# Patient Record
Sex: Male | Born: 1985 | Race: Black or African American | Hispanic: No | Marital: Single | Smoking: Current every day smoker
Health system: Southern US, Community
[De-identification: ages and names within clinical notes are randomized; demographics above are authoritative.]

---
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
## 2007-08-22 ENCOUNTER — Emergency Department (HOSPITAL_COMMUNITY): Admission: EM | Admit: 2007-08-22 | Discharge: 2007-08-22 | Payer: Self-pay | Admitting: Emergency Medicine

## 2007-09-04 ENCOUNTER — Emergency Department (HOSPITAL_COMMUNITY): Admission: EM | Admit: 2007-09-04 | Discharge: 2007-09-04 | Payer: Self-pay | Admitting: Emergency Medicine

## 2009-04-23 ENCOUNTER — Emergency Department (HOSPITAL_COMMUNITY): Admission: EM | Admit: 2009-04-23 | Discharge: 2009-04-23 | Payer: Self-pay | Admitting: Emergency Medicine

## 2009-11-19 IMAGING — CR DG CHEST 2V
2 series · 2 of 2 positions shown · non-contrast
Comparison: 08/22/2007

CLINICAL DATA: Dizzy/flank pain/short of breath

CHEST - 2 VIEW

[w chest pa]
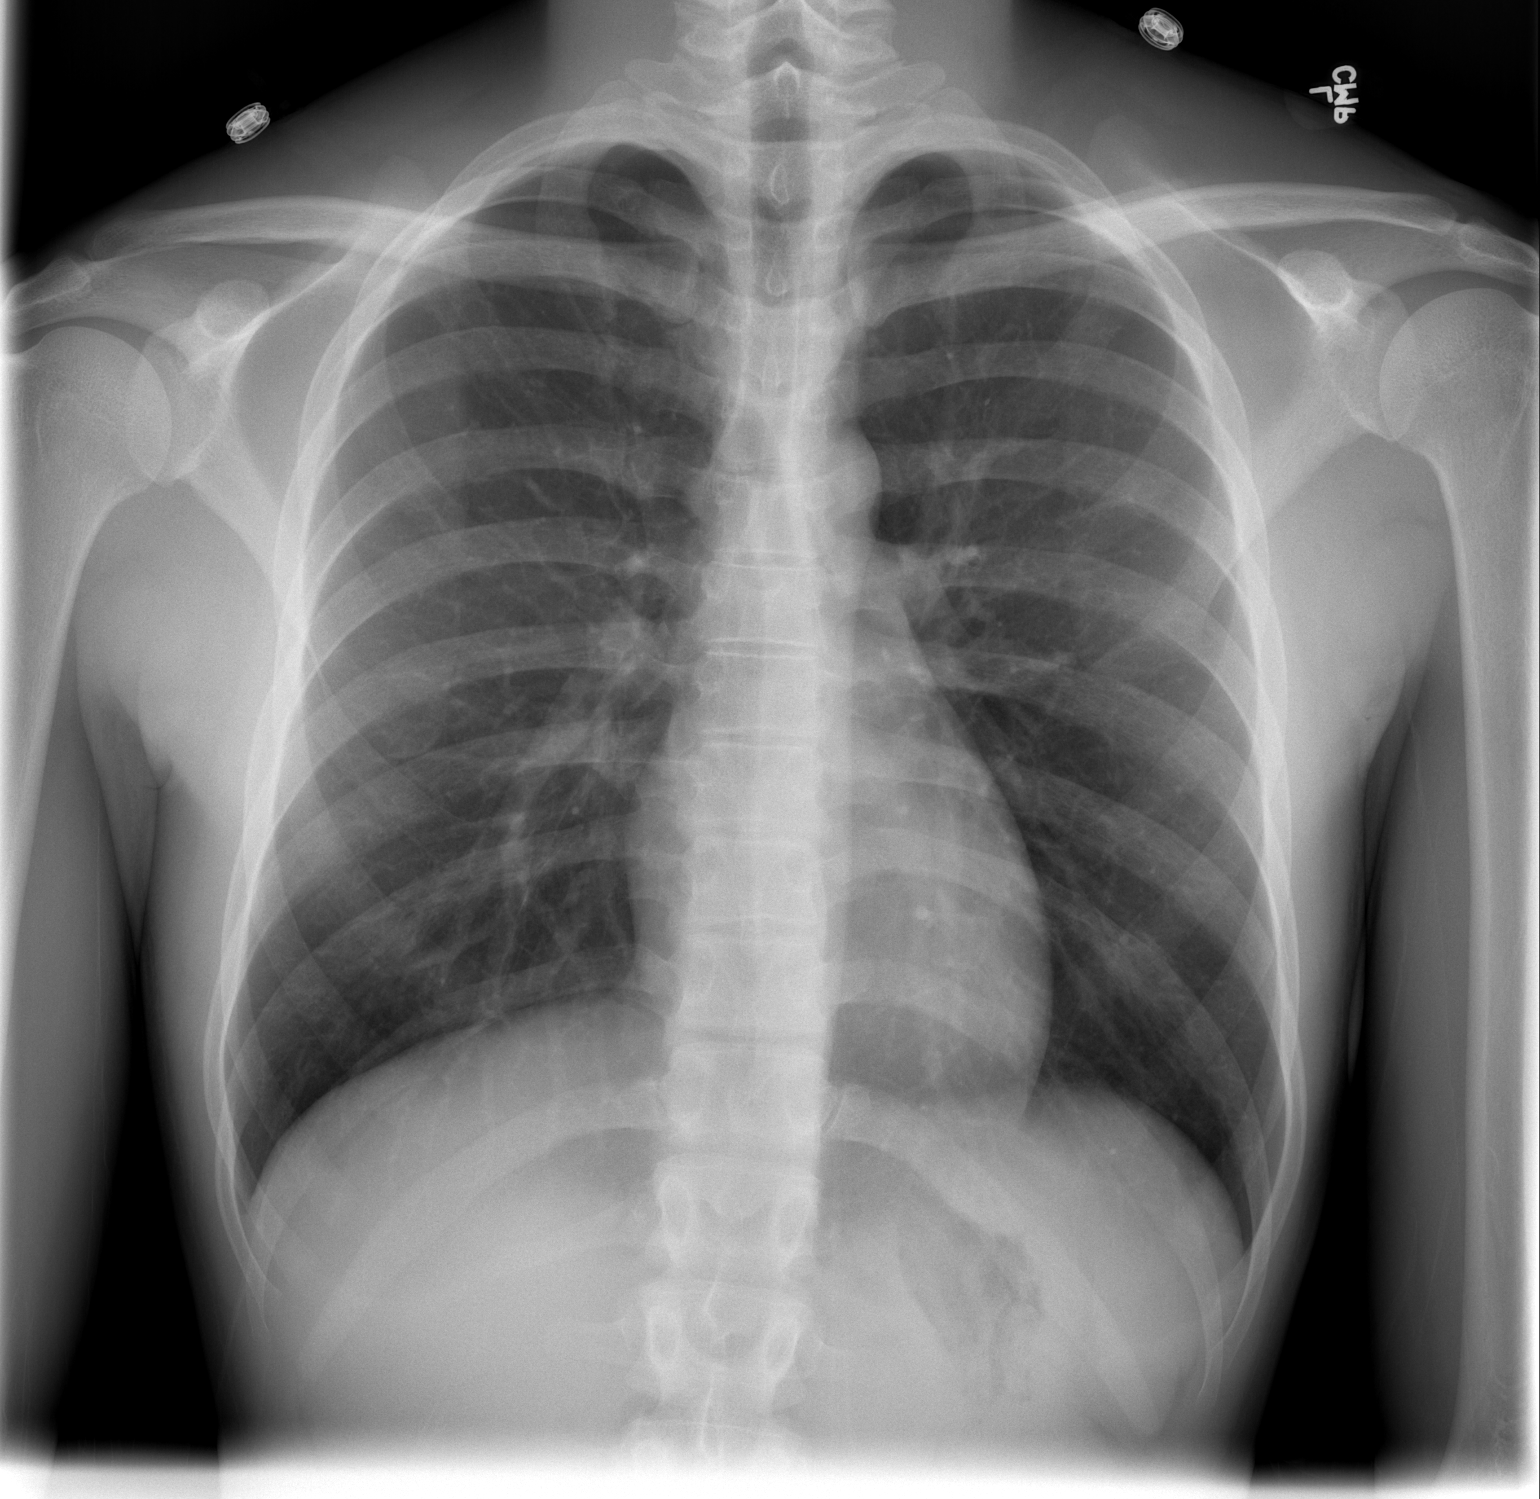

[w chest lat]
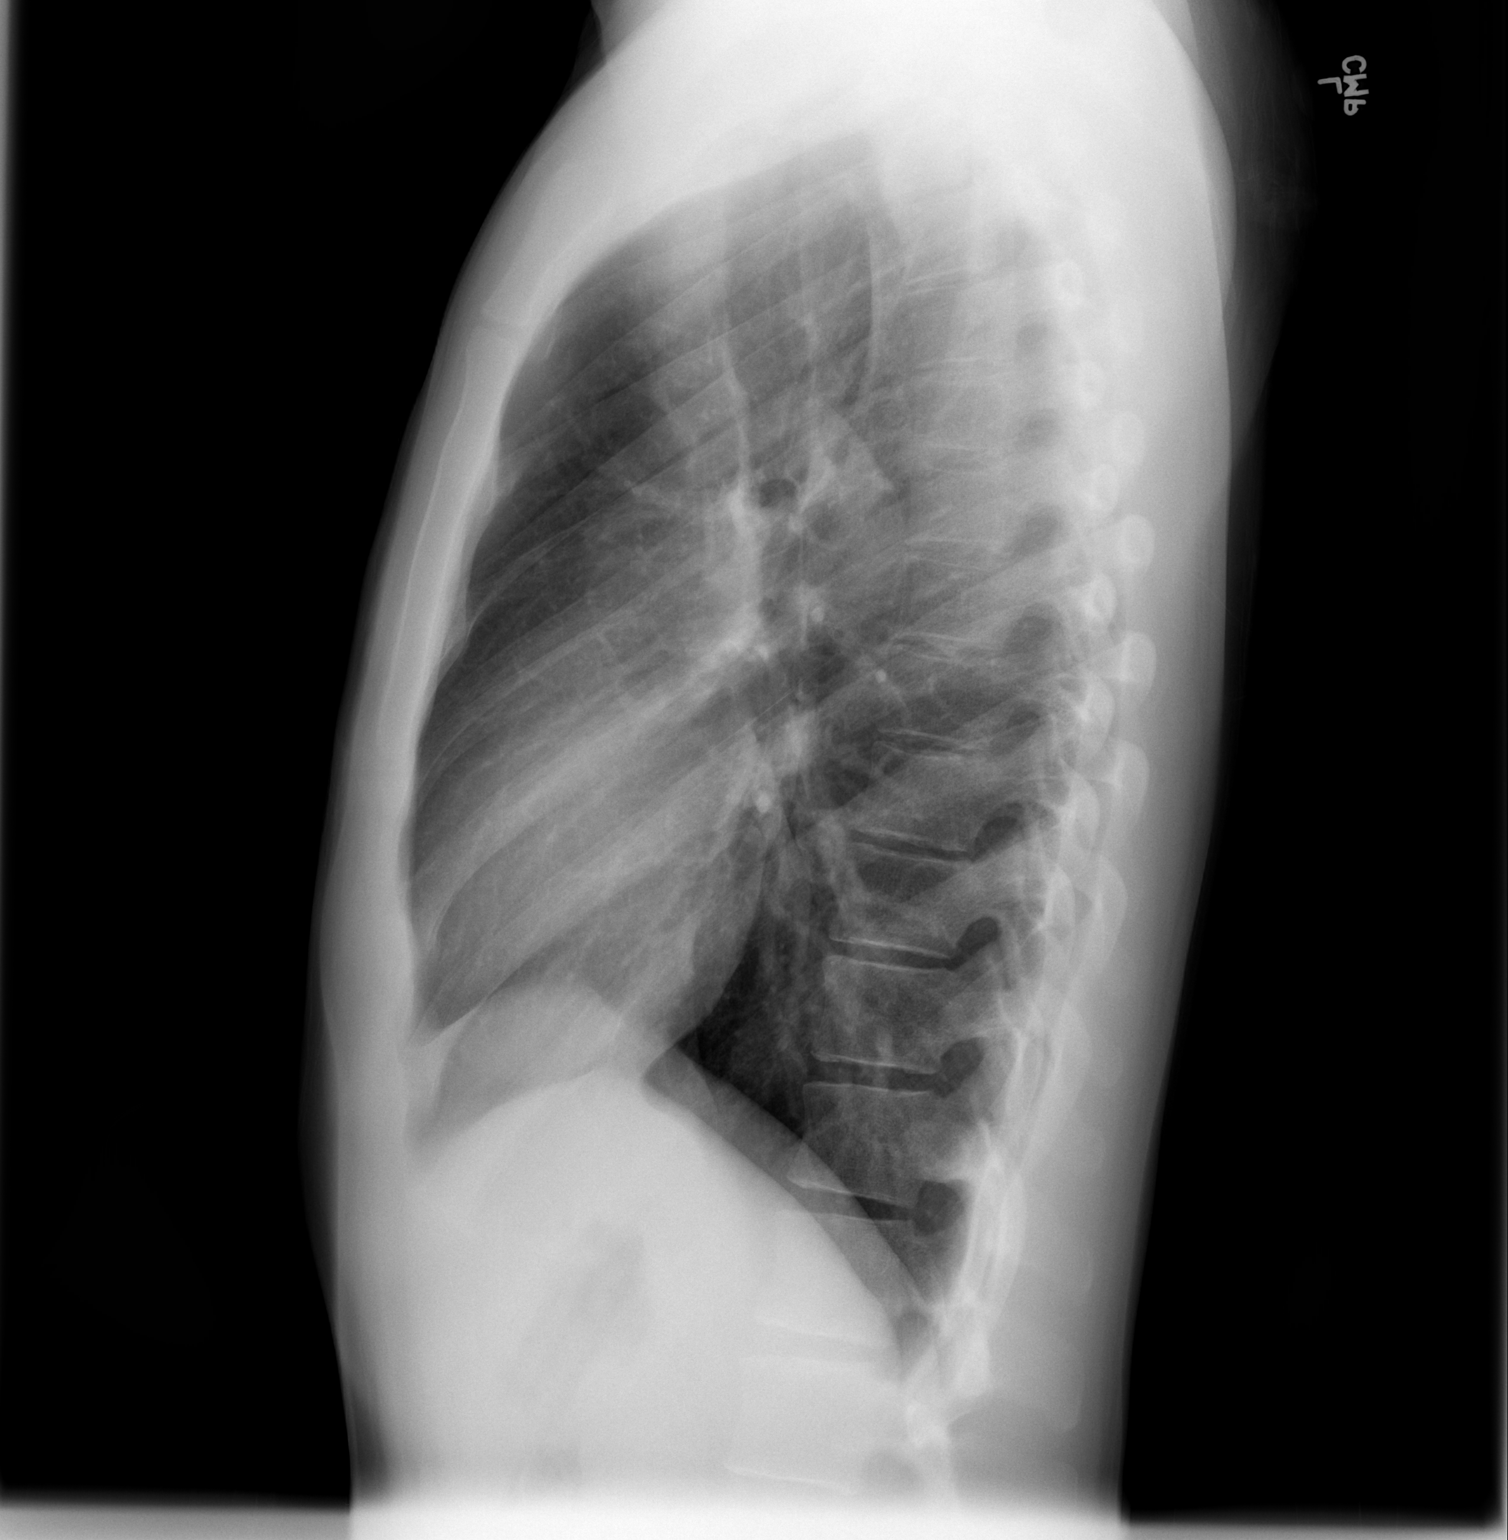

[2 of 2 positions shown; findings below may reference images not displayed]

FINDINGS: Heart and mediastinal contours normal.  Lungs clear.
Osseous structures intact.  No abnormality of the soft tissues.
IMPRESSION: No active disease.

## 2014-10-23 ENCOUNTER — Encounter (HOSPITAL_COMMUNITY): Payer: Self-pay | Admitting: *Deleted

## 2014-10-23 ENCOUNTER — Emergency Department (HOSPITAL_COMMUNITY)
Admission: EM | Admit: 2014-10-23 | Discharge: 2014-10-23 | Disposition: A | Discharge status: Home / Self Care | Payer: Self-pay | Attending: Emergency Medicine | Admitting: Emergency Medicine

## 2014-10-23 DIAGNOSIS — Z720 Tobacco use: Secondary | ICD-10-CM | POA: Insufficient documentation

## 2014-10-23 DIAGNOSIS — L989 Disorder of the skin and subcutaneous tissue, unspecified: Secondary | ICD-10-CM

## 2014-10-23 DIAGNOSIS — F419 Anxiety disorder, unspecified: Secondary | ICD-10-CM | POA: Insufficient documentation

## 2014-10-23 DIAGNOSIS — A749 Chlamydial infection, unspecified: Secondary | ICD-10-CM | POA: Insufficient documentation

## 2014-10-23 MED ORDER — AZITHROMYCIN 250 MG PO TABS
1000.0000 mg | ORAL_TABLET | Freq: Once | ORAL | Status: AC
  Administered 2014-10-23: 1000 mg via ORAL
  Filled 2014-10-23: qty 4

## 2014-10-23 MED ORDER — CEFTRIAXONE SODIUM 250 MG IJ SOLR
250.0000 mg | Freq: Once | INTRAMUSCULAR | Status: AC
  Administered 2014-10-23: 250 mg via INTRAMUSCULAR
  Filled 2014-10-23: qty 250

## 2014-10-23 MED ORDER — LIDOCAINE HCL (PF) 1 % IJ SOLN
INTRAMUSCULAR | Status: AC
  Administered 2014-10-23: 0.9 mL
  Filled 2014-10-23: qty 5

## 2014-10-23 NOTE — ED Notes (Signed)
PT reports a bump on penis hat as been there for one month.

## 2014-10-23 NOTE — Discharge Instructions (Signed)
[name],  [facility] (or primary care physician). Refrain from sexual contact with partner until they have been tested (and treated if test is positive). Nice meeting you.  Ortencia KickS. Nicole Jennine Peddy, PA-C  Chlamydia, Male Chlamydia is an infection. It is spread through sexual contact. Chlamydia can be in different areas of the body. These areas include the urethra, throat, or rectum. It is important to treat chlamydia as soon as possible. It can damage other organs.  CAUSES  Chlamydia is caused by bacteria. It is a sexually transmitted disease. This means that it is passed from an infected partner during intimate contact. This contact could be with the genitals, mouth, or rectal area.  SIGNS AND SYMPTOMS  There may not be any symptoms. This is often the case early in the infection. If there are symptoms, they are usually mild and may only be noticeable in the morning. Symptoms you may notice include:   Burning with urination.  Pain or swelling in the testicles.  Watery mucus-like discharge from the penis.  Long-standing (chronic) pelvic pain after frequent infections.  Pain, swelling, or itching around the anus.  A sore throat.  Itching, burning, or redness in the eyes, or discharge from the eyes. DIAGNOSIS  To diagnose this infection, your health care provider will do a pelvic exam. A sample of urine or a swab from the rectum may be taken for testing.  TREATMENT  Chlamydia is treated with antibiotic medicines. Your health care provider may test you for infection again 3 months after treatment. HOME CARE INSTRUCTIONS  Take your antibiotic medicine as directed by your health care provider. Finish the antibiotic even if you start to feel better. Incomplete treatment will put you at risk for not being able to have children (sterility).   Take medicines only as directed by your health care provider.   Rest.   Inform any sexual partners about your  infection. Even if they are symptom free or have a negative culture or evaluation, they should be treated for the condition.   Do not have sex (intercourse) until treatment is completed and your health care provider says it is okay.   Keep all follow-up visits as directed by your health care provider.   Not all test results are available during your visit. If your test results are not back during the visit, make an appointment with your health care provider to find out the results. Do not assume everything is normal if you have not heard from your health care provider or the medical facility. It is your responsibility to get your test results. SEEK MEDICAL CARE IF:  You develop new joint pain.  You have a fever. SEEK IMMEDIATE MEDICAL CARE IF:   Your pain increases.   You have abnormal discharge.   You have pain during intercourse. MAKE SURE YOU:   Understand these instructions.  Will watch your condition.  Will get help right away if you are not doing well or get worse.   This information is not intended to replace advice given to you by your health care provider. Make sure you discuss any questions you have with your health care provider.   Document Released: 12/24/2004 Document Revised: 01/14/2014 Document Reviewed: 07/02/2012 Elsevier Interactive Patient Education [employer]vier Inc.

## 2014-10-23 NOTE — ED Notes (Signed)
PT placed in gown and in bed. Pt monitored by pulse ox and bp cuff. 

## 2014-10-23 NOTE — ED Provider Notes (Signed)
CSN: [id number]     Arrival date & time 10/23/14  0815 History   First MD Initiated Contact with Patient 10/23/14 [phone]     Chief Complaint  Patient presents with  . Penis Pain     (Consider location/radiation/quality/duration/timing/severity/associated sxs/prior Treatment) HPI   Mark Wong is a 29 yo M presenting with a 1 month history of two bumps on his penis. He states they have not changed in size or color since he first noticed them, and he denies itching, discharge, or pain. He applied Aquaphor ointment yesterday without relief. In regards to sexual exposure, he states his girlfriend was diagnosed with chlamydia 1 month ago. He states she was treated but is unsure if they have had sexual contact since she was diagnosed.   History reviewed. No pertinent past medical history. History reviewed. No pertinent past surgical history. History reviewed. No pertinent family history. Social History  Substance Use Topics  . Smoking status: Current Every Day Smoker -- 0.50 packs/day    Types: Cigarettes  . Smokeless tobacco: Never Used  . Alcohol Use: No    Review of Systems  Constitutional: Negative.   Gastrointestinal: Negative.   Genitourinary: Negative.   Skin: Negative for color change, pallor, rash and wound.      Allergies  Review of patient's allergies indicates no known allergies.  Home Medications   Prior to Admission medications   Not on File   BP 130/77 mmHg  Pulse 99  Temp(Src) 98.2 F (36.8 C) (Oral)  Resp 16  SpO2 100% Physical Exam  Constitutional: He appears well-developed and well-nourished. No distress.  HENT:  Head: Normocephalic and atraumatic.  Mouth/Throat: Oropharynx is clear and moist.  Eyes: Conjunctivae are normal. Right eye exhibits no discharge. Left eye exhibits no discharge. No scleral icterus.  Pulmonary/Chest: Effort normal.  Genitourinary: No penile tenderness.  Neurological: He is alert. Coordination normal.  Skin: Skin is warm and  dry. No rash noted. He is not diaphoretic. No erythema. No pallor.  Two flesh colored macular papular lesions present on glans penis without ulcerations or erythema.   Psychiatric: He has a normal mood and affect. His behavior is normal.  Anxious appearing  Nursing note and vitals reviewed.   ED Course  Procedures (including critical care time) Labs Review Labs Reviewed - No data to display  Imaging Review No results found. I have personally reviewed and evaluated these images and lab results as part of my medical decision-making.   EKG Interpretation None      MDM   Final diagnoses:  None   The penile lesions are without ulcerations, discharge, and are painless. They do not appear chancrous, and do not resemble verrucae. The lesions are void of clinical concern. In regards to testing for chlamydia, Mark Wong is very anxious about the discomfort a urethral GC/Chlamydia swab may cause. Discussed with Dr. Judd Lienelo who recommended prophylactic treatment. Advised patient that girlfriend needs to be tested (and treated if necessary) prior to sexual contact. Will give 1 gm azithromycin and 250 mg rocephin. [facility]     Melton KrebsSamantha Nicole Jude Naclerio, PA-C 10/23/14 1005  Mark Lyonsouglas Delo, MD 10/23/14 [phone]

## 2021-04-25 ENCOUNTER — Emergency Department (HOSPITAL_COMMUNITY)
Admission: EM | Admit: 2021-04-25 | Discharge: 2021-04-25 | Disposition: A | Discharge status: Home / Self Care | Payer: Self-pay | Attending: Emergency Medicine | Admitting: Emergency Medicine

## 2021-04-25 ENCOUNTER — Other Ambulatory Visit: Payer: Self-pay

## 2021-04-25 ENCOUNTER — Encounter (HOSPITAL_COMMUNITY): Payer: Self-pay

## 2021-04-25 DIAGNOSIS — R42 Dizziness and giddiness: Secondary | ICD-10-CM | POA: Insufficient documentation

## 2021-04-25 DIAGNOSIS — R1110 Vomiting, unspecified: Secondary | ICD-10-CM | POA: Insufficient documentation

## 2021-04-25 DIAGNOSIS — R55 Syncope and collapse: Secondary | ICD-10-CM | POA: Insufficient documentation

## 2021-04-25 DIAGNOSIS — R739 Hyperglycemia, unspecified: Secondary | ICD-10-CM | POA: Insufficient documentation

## 2021-04-25 DIAGNOSIS — R197 Diarrhea, unspecified: Secondary | ICD-10-CM | POA: Insufficient documentation

## 2021-04-25 LAB — URINALYSIS, ROUTINE W REFLEX MICROSCOPIC
Bilirubin Urine: NEGATIVE
Glucose, UA: NEGATIVE mg/dL
Hgb urine dipstick: NEGATIVE
Ketones, ur: NEGATIVE mg/dL
Leukocytes,Ua: NEGATIVE
Nitrite: NEGATIVE
Protein, ur: NEGATIVE mg/dL
Specific Gravity, Urine: 1.025 (ref 1.005–1.030)
pH: 5 (ref 5.0–8.0)

## 2021-04-25 LAB — CBC
HCT: 46.4 % (ref 39.0–52.0)
Hemoglobin: 15.4 g/dL (ref 13.0–17.0)
MCH: 30.1 pg (ref 26.0–34.0)
MCHC: 33.2 g/dL (ref 30.0–36.0)
MCV: 90.6 fL (ref 80.0–100.0)
Platelets: 298 10*3/uL (ref 150–400)
RBC: 5.12 MIL/uL (ref 4.22–5.81)
RDW: 13.4 % (ref 11.5–15.5)
WBC: 6.7 10*3/uL (ref 4.0–10.5)
nRBC: 0 % (ref 0.0–0.2)

## 2021-04-25 LAB — BASIC METABOLIC PANEL
Anion gap: 9 (ref 5–15)
BUN: 15 mg/dL (ref 6–20)
CO2: 25 mmol/L (ref 22–32)
Calcium: 9.6 mg/dL (ref 8.9–10.3)
Chloride: 102 mmol/L (ref 98–111)
Creatinine, Ser: 1.21 mg/dL (ref 0.61–1.24)
GFR, Estimated: >60 mL/min (ref >60)
Glucose, Bld: 167 mg/dL — ABNORMAL HIGH (ref 70–99)
Potassium: 3.4 mmol/L — ABNORMAL LOW (ref 3.5–5.1)
Sodium: 136 mmol/L (ref 135–145)

## 2021-04-25 NOTE — Discharge Instructions (Signed)
You need to follow-up with a primary care doctor to have your sugar rechecked.  Return to the emergency room if you have any worsening symptoms. ?

## 2021-04-25 NOTE — ED Notes (Signed)
Pt verbalized understanding of d/c instructions, meds, and followup care. Denies questions. VSS, no distress noted. Steady gait to exit with all belongings.  ?

## 2021-04-25 NOTE — ED Provider Notes (Signed)
?[facility] EMERGENCY DEPARTMENT ?Provider Note ? ? ?CSN: [id number] ?Arrival date & time: 04/25/21  1750 ? ?  ? ?History ? ?Chief Complaint  ?Patient presents with  ? Loss of Consciousness  ? ? ?Mark Wong is a 36 y.o. male. ? ?Patient is a 36 year old male who presents after having a syncopal episode yesterday.  The day before he had what he felt was a GI bug with vomiting and diarrhea.  He was feeling better yesterday but still felt weak and had not been eating or drinking much.  He stood up and got dizzy and then had a brief syncopal episode.  He denies any injuries from the syncope.  His wife witnessed this and says it was very brief.  He had no associated seizure-like activity.  He says he has felt fine all day today.  He has felt much better.  He has no ongoing dizziness.  No chest pain or shortness of breath.  No palpitations.  No history of similar symptoms in the past.  He has no ongoing vomiting or diarrhea.  No associate abdominal pain.  No fevers.  No known past medical history. ? ? ?  ? ?Home Medications ?Prior to Admission medications   ?Medication Sig Start Date End Date Taking? Authorizing Provider  ?mineral oil-hydrophilic petrolatum (AQUAPHOR) ointment Apply 1 application topically as needed for dry skin or irritation.    [provider]  ?   ? ?Allergies    ?Patient has no known allergies.   ? ?Review of Systems   ?Review of Systems  ?Constitutional:  Negative for chills, diaphoresis, fatigue and fever.  ?HENT:  Negative for congestion, rhinorrhea and sneezing.   ?Eyes: Negative.   ?Respiratory:  Negative for cough, chest tightness and shortness of breath.   ?Cardiovascular:  Negative for chest pain and leg swelling.  ?Gastrointestinal:  Positive for diarrhea, nausea and vomiting. Negative for abdominal pain and blood in stool.  ?Genitourinary:  Negative for difficulty urinating, flank pain, frequency and hematuria.  ?Musculoskeletal:  Negative for arthralgias and  back pain.  ?Skin:  Negative for rash.  ?Neurological:  Positive for syncope and light-headedness. Negative for dizziness, speech difficulty, weakness, numbness and headaches.  ? ?Physical Exam ?Updated Vital Signs ?BP 114/83   Pulse 76   Temp 99.1 ?F (37.3 ?C) (Oral)   Resp 16   Ht 5\' 4"  (1.626 m)   Wt 59 kg   SpO2 99%   BMI 22.31 kg/m?  ?Physical Exam ?Constitutional:   ?   Appearance: He is well-developed.  ?HENT:  ?   Head: Normocephalic and atraumatic.  ?Eyes:  ?   Pupils: Pupils are equal, round, and reactive to light.  ?Cardiovascular:  ?   Rate and Rhythm: Normal rate and regular rhythm.  ?   Heart sounds: Normal heart sounds.  ?Pulmonary:  ?   Effort: Pulmonary effort is normal. No respiratory distress.  ?   Breath sounds: Normal breath sounds. No wheezing or rales.  ?Chest:  ?   Chest wall: No tenderness.  ?Abdominal:  ?   General: Bowel sounds are normal.  ?   Palpations: Abdomen is soft.  ?   Tenderness: There is no abdominal tenderness. There is no guarding or rebound.  ?Musculoskeletal:     ?   General: Normal range of motion.  ?   Cervical back: Normal range of motion and neck supple.  ?Lymphadenopathy:  ?   Cervical: No cervical adenopathy.  ?Skin: ?  General: Skin is warm and dry.  ?   Findings: No rash.  ?Neurological:  ?   General: No focal deficit present.  ?   Mental Status: He is alert and oriented to person, place, and time.  ?   Comments: Motor 5/5 all extremities ?Sensation grossly intact to LT all extremities ?Finger to Nose intact, no pronator drift ?CN II-XII grossly intact ?Gait normal ?  ? ? ?ED Results / Procedures / Treatments   ?Labs ?(all labs ordered are listed, but only abnormal results are displayed) ?Labs Reviewed  ?BASIC METABOLIC PANEL - Abnormal; Notable for the following components:  ?    Result Value  ? Potassium 3.4 (*)   ? Glucose, Bld 167 (*)   ? All other components within normal limits  ?CBC  ?URINALYSIS, ROUTINE W REFLEX MICROSCOPIC  ? ? ?EKG ?EKG  Interpretation ? ?Date/Time:  Wednesday April 25 2021 17:56:05 EDT ?Ventricular Rate:  108 ?PR Interval:  124 ?QRS Duration: 80 ?QT Interval:  316 ?QTC Calculation: 423 ?R Axis:   57 ?Text Interpretation: Sinus tachycardia Otherwise normal ECG When compared with ECG of 04-Sep-2007 12:57, PREVIOUS ECG IS PRESENT Confirmed by Rolan Bucco [phone]) on 04/25/2021 9:26:08 PM ? ?Radiology ?No results found. ? ?Procedures ?Procedures  ? ? ?Medications Ordered in ED ?Medications - No data to display ? ?ED Course/ Medical Decision Making/ A&P ?  ?                        ?Medical Decision Making ? ?Patient presents after a syncopal episode yesterday.  It sounds like it was a vasovagal type event.  He had no preceding symptoms such as chest pain or palpitations.  He did stand up and felt lightheaded and then passed out right after that.  He has had no symptoms today.  No ongoing vomiting or diarrhea.  No abdominal pain that would be more concerning for an intra-abdominal etiology.  His EKG does not show any arrhythmias or ischemic changes.  His vital signs are stable.  His labs are reviewed and are nonconcerning.  His glucose is a bit elevated.  I discussed this with him.  It may be a stress reaction but he does need to have this rechecked by primary care doctor.  He is agreeable to this.  He is able to ambulate without symptoms.  At this point I do not feel that he needs inpatient hospitalization.  He is appropriate for discharge.  He was discharged home in good condition.  He was encouraged to continue drinking fluids.  Return precautions were given. ? ?Final Clinical Impression(s) / ED Diagnoses ?Final diagnoses:  ?Vasovagal syncope  ?Hyperglycemia  ? ? ?Rx / DC Orders ?ED Discharge Orders   ? ? None  ? ?  ? ? ?  ?Rolan Bucco, MD ?04/25/21 2142 ? ?

## 2021-04-25 NOTE — ED Provider Triage Note (Signed)
Emergency Medicine Provider Triage Evaluation Note ? ?Mark Wong , a 36 y.o. male  was evaluated in triage.  Pt complains of syncopal episode that occurred yesterday.  Patient had a GI bug the day before yesterday and then yesterday he was getting out of bed and wife reports a syncopal episode.  Says he was only unconscious for a couple seconds.  No history of seizure, syncope or arrhythmia.  Patient reports feeling completely fine now and thinks maybe he was dehydrated ? ?Review of Systems  ?Positive: None ?Negative: Dizziness, syncope, headache, nausea, vomiting or diarrhea ? ?Physical Exam  ?BP 122/84 (BP Location: Right Arm)   Pulse 100   Temp 99.1 ?F (37.3 ?C) (Oral)   Resp 14   Ht 5\' 4"  (1.626 m)   Wt 59 kg   SpO2 97%   BMI 22.31 kg/m?  ?Gen:   Awake, no distress   ?Resp:  Normal effort  ?MSK:   Moves extremities without difficulty  ?Other:  PERRLA, alert and oriented ? ?Medical Decision Making  ?Medically screening exam initiated at 6:20 PM.  Appropriate orders placed.  Mark Wong was informed that the remainder of the evaluation will be completed by another provider, this initial triage assessment does not replace that evaluation, and the importance of remaining in the ED until their evaluation is complete. ? ? ?  ?Rhae Hammock, PA-C ?04/25/21 1821 ? ?

## 2021-04-25 NOTE — ED Triage Notes (Signed)
Pt arrived POV from home c/o of LOC. Pt states he just got over a stomach bug and was standing and passed out yesterday. Pt thinks he was dehydrated but wants to make sure he is good.  ?

## 2021-12-02 ENCOUNTER — Emergency Department (HOSPITAL_COMMUNITY)
Admission: EM | Admit: 2021-12-02 | Discharge: 2021-12-02 | Disposition: A | Discharge status: Home / Self Care | Payer: Self-pay | Attending: Student | Admitting: Student

## 2021-12-02 ENCOUNTER — Other Ambulatory Visit: Payer: Self-pay

## 2021-12-02 ENCOUNTER — Encounter (HOSPITAL_COMMUNITY): Payer: Self-pay | Admitting: Emergency Medicine

## 2021-12-02 DIAGNOSIS — R300 Dysuria: Secondary | ICD-10-CM | POA: Insufficient documentation

## 2021-12-02 DIAGNOSIS — R369 Urethral discharge, unspecified: Secondary | ICD-10-CM | POA: Insufficient documentation

## 2021-12-02 DIAGNOSIS — F17210 Nicotine dependence, cigarettes, uncomplicated: Secondary | ICD-10-CM | POA: Insufficient documentation

## 2021-12-02 LAB — URINALYSIS, ROUTINE W REFLEX MICROSCOPIC
Bacteria, UA: NONE SEEN
Bilirubin Urine: NEGATIVE
Glucose, UA: NEGATIVE mg/dL
Ketones, ur: NEGATIVE mg/dL
Nitrite: NEGATIVE
Protein, ur: 30 mg/dL — AB
Specific Gravity, Urine: 1.026 (ref 1.005–1.030)
WBC, UA: >50 WBC/hpf — ABNORMAL HIGH (ref 0–5)
pH: 5 (ref 5.0–8.0)

## 2021-12-02 LAB — HIV ANTIBODY (ROUTINE TESTING W REFLEX): HIV Screen 4th Generation wRfx: NONREACTIVE

## 2021-12-02 MED ORDER — CEFADROXIL 500 MG PO CAPS: 500.0000 mg | ORAL_CAPSULE | Freq: Two times a day (BID) | ORAL | 0 refills | Status: AC

## 2021-12-02 MED ORDER — CEFTRIAXONE SODIUM 500 MG IJ SOLR
500.0000 mg | Freq: Once | INTRAMUSCULAR | Status: AC
Start: 2021-12-02 — End: 2021-12-02
  Administered 2021-12-02: 500 mg via INTRAMUSCULAR
  Filled 2021-12-02: qty 500

## 2021-12-02 MED ORDER — DOXYCYCLINE HYCLATE 100 MG PO TABS
100.0000 mg | ORAL_TABLET | Freq: Once | ORAL | Status: AC
  Administered 2021-12-02: 100 mg via ORAL
  Filled 2021-12-02: qty 1

## 2021-12-02 MED ORDER — LIDOCAINE HCL (PF) 1 % IJ SOLN
1.0000 mL | Freq: Once | INTRAMUSCULAR | Status: AC
  Administered 2021-12-02: 1 mL
  Filled 2021-12-02: qty 5

## 2021-12-02 MED ORDER — DOXYCYCLINE HYCLATE 100 MG PO CAPS: 100.0000 mg | ORAL_CAPSULE | Freq: Two times a day (BID) | ORAL | 0 refills | Status: AC

## 2021-12-02 NOTE — ED Notes (Signed)
RN called lab to check on pending tests. Lab states they do not run in house wet preps. EDP made aware.

## 2021-12-02 NOTE — ED Notes (Signed)
Brittany(RN) called pt x4 for room, no response.

## 2021-12-02 NOTE — ED Provider Notes (Signed)
[facility] EMERGENCY DEPARTMENT Provider Note  CSN: [id number] Arrival date & time: 12/02/21 1165  Chief Complaint(s) STD Screening  HPI Mark Wong is a 36 y.o. male who presents emergency room for evaluation of penile discharge.  Patient endorsing penile discharge and dysuria over the last 2 to 3 days.  Partner in the room and denies symptoms.  Denies chest pain, shortness of breath, abdominal pain, nausea, vomiting or other systemic symptoms.   Past Medical History History reviewed. No pertinent past medical history. There are no problems to display for this patient.  Home Medication(s) Prior to Admission medications   Medication Sig Start Date End Date Taking? Authorizing Provider  mineral oil-hydrophilic petrolatum (AQUAPHOR) ointment Apply 1 application topically as needed for dry skin or irritation.    [provider]                                                                                                                                    Past Surgical History History reviewed. No pertinent surgical history. Family History History reviewed. No pertinent family history.  Social History Social History   Tobacco Use   Smoking status: Every Day    Packs/day: 0.50    Types: Cigarettes   Smokeless tobacco: Never  Substance Use Topics   Alcohol use: No   Drug use: No   Allergies Patient has no known allergies.  Review of Systems Review of Systems  Genitourinary:  Positive for dysuria and penile discharge.    Physical Exam Vital Signs  I have reviewed the triage vital signs BP (!) 126/107   Pulse 76   Temp 98.8 F (37.1 C)   Resp 14   SpO2 100%   Physical Exam Vitals and nursing note reviewed.  Constitutional:      General: He is not in acute distress.    Appearance: He is well-developed.  HENT:     Head: Normocephalic and atraumatic.  Eyes:     Conjunctiva/sclera: Conjunctivae normal.  Cardiovascular:     Rate  and Rhythm: Normal rate and regular rhythm.     Heart sounds: No murmur heard. Pulmonary:     Effort: Pulmonary effort is normal. No respiratory distress.     Breath sounds: Normal breath sounds.  Abdominal:     Palpations: Abdomen is soft.     Tenderness: There is no abdominal tenderness.  Genitourinary:    Comments: Milky penile discharge Musculoskeletal:        General: No swelling.     Cervical back: Neck supple.  Skin:    General: Skin is warm and dry.     Capillary Refill: Capillary refill takes less than 2 seconds.  Neurological:     Mental Status: He is alert.  Psychiatric:        Mood and Affect: Mood normal.     ED Results and Treatments Labs (all labs ordered are listed, but  only abnormal results are displayed) Labs Reviewed  URINALYSIS, ROUTINE W REFLEX MICROSCOPIC - Abnormal; Notable for the following components:      Result Value   APPearance CLOUDY (*)    Hgb urine dipstick MODERATE (*)    Protein, ur 30 (*)    Leukocytes,Ua LARGE (*)    WBC, UA >50 (*)    All other components within normal limits  WET PREP, GENITAL  URINE CULTURE  HIV ANTIBODY (ROUTINE TESTING W REFLEX)  RPR  GC/CHLAMYDIA PROBE AMP ([place]) NOT AT [facility]                                                                                                                          Radiology No results found.  Pertinent labs & imaging results that were available during my care of the patient were reviewed by me and considered in my medical decision making (see MDM for details).  Medications Ordered in ED Medications  cefTRIAXone (ROCEPHIN) injection 500 mg (500 mg Intramuscular Given 12/02/21 1606)  lidocaine (PF) (XYLOCAINE) 1 % injection 1-2.1 mL (1 mL Other Given 12/02/21 1606)  doxycycline (VIBRA-TABS) tablet 100 mg (100 mg Oral Given 12/02/21 1606)                                                                                                                                      Procedures Procedures  (including critical care time)  Medical Decision Making / ED Course   This patient presents to the ED for concern of penile discharge, this involves an extensive number of treatment options, and is a complaint that carries with it a high risk of complications and morbidity.  The differential diagnosis includes gonorrhea, chlamydia, trichomonas, venereal disease, prostatitis  MDM: Patient seen emergency room for evaluation of penile discharge.  Physical exam with visible puslike discharge at the glans penis but is otherwise unremarkable.  Urinalysis concerning for concomitant UTI.  Patient empirically treated with ceftriaxone and doxycycline and will receive home Duricef and doxycycline for concomitant UTI.  Additional STI labs sent and are pending at time of discharge.  Patient then discharged with outpatient follow-up.   Additional history obtained: -Additional history obtained from wife -External records from outside source obtained and reviewed including: Chart review including previous notes, labs, imaging, consultation notes   Lab Tests: -I ordered, reviewed, and interpreted labs.   The pertinent results  include:   Labs Reviewed  URINALYSIS, ROUTINE W REFLEX MICROSCOPIC - Abnormal; Notable for the following components:      Result Value   APPearance CLOUDY (*)    Hgb urine dipstick MODERATE (*)    Protein, ur 30 (*)    Leukocytes,Ua LARGE (*)    WBC, UA >50 (*)    All other components within normal limits  WET PREP, GENITAL  URINE CULTURE  HIV ANTIBODY (ROUTINE TESTING W REFLEX)  RPR  GC/CHLAMYDIA PROBE AMP ([place]) NOT AT [facility]       Medicines ordered and prescription drug management: Meds ordered this encounter  Medications   cefTRIAXone (ROCEPHIN) injection 500 mg    Order Specific Question:   Antibiotic Indication:    Answer:   STD   lidocaine (PF) (XYLOCAINE) 1 % injection 1-2.1 mL   doxycycline (VIBRA-TABS) tablet 100 mg    -I  have reviewed the patients home medicines and have made adjustments as needed  Critical interventions none  Cardiac Monitoring: The patient was maintained on a cardiac monitor.  I personally viewed and interpreted the cardiac monitored which showed an underlying rhythm of: NSR  Social Determinants of Health:  Factors impacting patients care include: none   Reevaluation: After the interventions noted above, I reevaluated the patient and found that they have :improved  Co morbidities that complicate the patient evaluation History reviewed. No pertinent past medical history.    Dispostion: I considered admission for this patient, but he does not meet inpatient criteria for admission he is safe for discharge with outpatient follow-up     Final Clinical Impression(s) / ED Diagnoses Final diagnoses:  None     @PCDICTATION @    Aleyah Balik, , MD 12/03/21 [phone]

## 2021-12-02 NOTE — ED Triage Notes (Signed)
Patient requesting STD screening reports whitish penile discharge with mild dysuria for several days .

## 2021-12-03 LAB — URINE CYTOLOGY ANCILLARY ONLY
Comment: NEGATIVE
Trichomonas: NEGATIVE

## 2021-12-03 LAB — RPR: RPR Ser Ql: NONREACTIVE
